# Patient Record
Sex: Female | Born: 1970 | Hispanic: Yes | Marital: Married | Smoking: Never smoker
Health system: Southern US, Community
[De-identification: ages and names within clinical notes are randomized; demographics above are authoritative.]

## PROBLEM LIST (undated history)

## (undated) DIAGNOSIS — I10 Essential (primary) hypertension: Secondary | ICD-10-CM

## (undated) HISTORY — DX: Essential (primary) hypertension: I10

## (undated) HISTORY — PX: BREAST EXCISIONAL BIOPSY: SUR124

---

## 2007-04-29 ENCOUNTER — Encounter: Payer: Self-pay | Admitting: Maternal & Fetal Medicine

## 2007-05-16 ENCOUNTER — Emergency Department: Payer: Self-pay | Admitting: Emergency Medicine

## 2007-09-23 ENCOUNTER — Inpatient Hospital Stay: Payer: Self-pay

## 2014-06-09 ENCOUNTER — Other Ambulatory Visit: Payer: Self-pay | Admitting: Oncology

## 2014-06-09 DIAGNOSIS — Z139 Encounter for screening, unspecified: Secondary | ICD-10-CM

## 2014-07-10 ENCOUNTER — Encounter (INDEPENDENT_AMBULATORY_CARE_PROVIDER_SITE_OTHER): Payer: Self-pay

## 2014-07-10 ENCOUNTER — Ambulatory Visit
Admission: RE | Admit: 2014-07-10 | Discharge: 2014-07-10 | Disposition: A | Discharge status: Home / Self Care | Payer: Self-pay | Source: Ambulatory Visit | Attending: Oncology | Admitting: Oncology

## 2014-07-10 ENCOUNTER — Other Ambulatory Visit: Payer: Self-pay

## 2014-07-10 ENCOUNTER — Ambulatory Visit: Payer: Self-pay | Attending: Oncology

## 2014-07-10 VITALS — BP 126/83 | HR 70 | Temp 96.7°F | Resp 20

## 2014-07-10 DIAGNOSIS — R920 Mammographic microcalcification found on diagnostic imaging of breast: Secondary | ICD-10-CM

## 2014-07-10 DIAGNOSIS — N63 Unspecified lump in unspecified breast: Secondary | ICD-10-CM

## 2014-07-10 DIAGNOSIS — Z139 Encounter for screening, unspecified: Secondary | ICD-10-CM

## 2014-07-10 DIAGNOSIS — Z0000 Encounter for general adult medical examination without abnormal findings: Secondary | ICD-10-CM

## 2014-07-10 NOTE — Progress Notes (Signed)
Subjective:     Patient ID: Lynn ConnorsCristina Gonzalez Hawkins, female   DOB: 07-20-1970, 44 y.o.   MRN: [id number]  HPI   Review of Systems     Objective:   Physical Exam  Pulmonary/Chest: Right breast exhibits no inverted nipple, no mass, no nipple discharge, no skin change and no tenderness. Left breast exhibits no inverted nipple, no mass, no nipple discharge, no skin change and no tenderness. Breasts are symmetrical.       Assessment:    44 [facility]  CBE unremarkable.  Instructed patient on breast self-exam using teach back method.  Patient screened, and meets [facility] eligibility.  Patient does not have insurance, Medicare or Medicaid.  Handout given on Affordable Care Act.    Plan:     Sent for bilateral screening mammogram.

## 2014-07-18 ENCOUNTER — Ambulatory Visit
Admission: RE | Admit: 2014-07-18 | Discharge: 2014-07-18 | Disposition: A | Discharge status: Home / Self Care | Payer: Self-pay | Source: Ambulatory Visit | Attending: Oncology | Admitting: Oncology

## 2014-07-18 ENCOUNTER — Ambulatory Visit: Payer: Self-pay

## 2014-07-18 ENCOUNTER — Other Ambulatory Visit: Payer: Self-pay

## 2014-07-18 DIAGNOSIS — R921 Mammographic calcification found on diagnostic imaging of breast: Secondary | ICD-10-CM | POA: Insufficient documentation

## 2014-07-18 DIAGNOSIS — N63 Unspecified lump in unspecified breast: Secondary | ICD-10-CM

## 2014-07-18 DIAGNOSIS — R920 Mammographic microcalcification found on diagnostic imaging of breast: Secondary | ICD-10-CM

## 2014-07-25 ENCOUNTER — Other Ambulatory Visit: Payer: Self-pay

## 2014-07-25 DIAGNOSIS — N63 Unspecified lump in unspecified breast: Secondary | ICD-10-CM

## 2014-08-01 NOTE — Progress Notes (Signed)
Patient is scheduled for 6 month follow-up mammogram, and ultrasound of left breast mass on January 19, 2015 at 1:20 p.m.  Mailed appointment notification.

## 2015-01-15 ENCOUNTER — Ambulatory Visit: Payer: Self-pay

## 2015-01-15 ENCOUNTER — Other Ambulatory Visit: Payer: Self-pay

## 2015-01-19 ENCOUNTER — Ambulatory Visit: Payer: Self-pay

## 2015-01-19 ENCOUNTER — Other Ambulatory Visit: Payer: Self-pay

## 2015-01-23 NOTE — Progress Notes (Signed)
Per Neysa Bonitohristy Burton's in-basket note, patient has rescheduled six month follow-up mammogram, and ultrasound to 02/16/15 at 9:20.

## 2015-01-26 ENCOUNTER — Other Ambulatory Visit: Payer: Self-pay

## 2015-01-26 ENCOUNTER — Ambulatory Visit: Payer: Self-pay

## 2015-02-16 ENCOUNTER — Other Ambulatory Visit: Payer: Self-pay

## 2015-02-16 ENCOUNTER — Ambulatory Visit
Admission: RE | Admit: 2015-02-16 | Discharge: 2015-02-16 | Disposition: A | Discharge status: Home / Self Care | Payer: Self-pay | Source: Ambulatory Visit | Attending: Oncology | Admitting: Oncology

## 2015-02-16 DIAGNOSIS — N63 Unspecified lump in unspecified breast: Secondary | ICD-10-CM

## 2015-07-18 ENCOUNTER — Ambulatory Visit
Admission: RE | Admit: 2015-07-18 | Discharge: 2015-07-18 | Disposition: A | Discharge status: Home / Self Care | Payer: Self-pay | Source: Ambulatory Visit | Attending: Oncology | Admitting: Oncology

## 2015-07-18 ENCOUNTER — Ambulatory Visit: Payer: Self-pay | Attending: Oncology | Admitting: *Deleted

## 2015-07-18 ENCOUNTER — Encounter: Payer: Self-pay | Admitting: *Deleted

## 2015-07-18 VITALS — BP 130/80 | HR 76 | Temp 98.2°F

## 2015-07-18 DIAGNOSIS — N63 Unspecified lump in unspecified breast: Secondary | ICD-10-CM

## 2015-07-18 NOTE — Progress Notes (Signed)
Subjective:     Patient ID: Otilio ConnorsCristina Gonzalez Sandoval, female   DOB: [birth year], 45 y.o.   MRN: [id number]  HPI   Review of Systems     Objective:   Physical Exam  Pulmonary/Chest: Right breast exhibits no inverted nipple, no mass, no nipple discharge, no skin change and no tenderness. Left breast exhibits no inverted nipple, no mass, no nipple discharge, no skin change and no tenderness. Breasts are symmetrical.    Bilateral nipples with transverse slit       Assessment:     45 [facility] Hispanic [facility]  Maritza, the interpreter present during the interview and exam.  Clinical breast exam unremarkable with notable fibroglandular tissue in the left breast.  Patient due for diagnostic mammo for 6 month f/u and annual for left breast mass.  Taught self breast awareness.  Patient has been screened for eligibility.  She does not have any insurance, Medicare or Medicaid.  She also meets financial eligibility.  Hand-out given on the Affordable Care Act.    Plan:     Bilateral diagnostic mammogram with ultrasound.  Will follow-up per [facility] protocol.

## 2015-08-07 ENCOUNTER — Encounter: Payer: Self-pay | Admitting: *Deleted

## 2015-08-07 NOTE — Progress Notes (Signed)
Patient informed of her mammogram results the day of her mammo by the radiologist.  She is to follow-up in one year with diagnostic mammogram.  [facility].

## 2016-07-14 ENCOUNTER — Ambulatory Visit: Payer: Self-pay

## 2016-07-30 ENCOUNTER — Ambulatory Visit
Admission: RE | Admit: 2016-07-30 | Discharge: 2016-07-30 | Disposition: A | Discharge status: Home / Self Care | Payer: Self-pay | Source: Ambulatory Visit | Attending: Oncology | Admitting: Oncology

## 2016-07-30 ENCOUNTER — Ambulatory Visit: Payer: Self-pay | Attending: Oncology

## 2016-07-30 ENCOUNTER — Encounter (INDEPENDENT_AMBULATORY_CARE_PROVIDER_SITE_OTHER): Payer: Self-pay

## 2016-07-30 VITALS — BP 166/91 | HR 76 | Temp 98.7°F | Resp 18 | Ht 60.0 in | Wt 195.0 lb

## 2016-07-30 DIAGNOSIS — N63 Unspecified lump in unspecified breast: Secondary | ICD-10-CM

## 2016-07-30 NOTE — Progress Notes (Signed)
Subjective:     Patient ID: Lynn Hawkins, female   DOB: Jul 12, 1970, 46 y.o.   MRN: [id number]  HPI   Review of Systems     Objective:   Physical Exam  Pulmonary/Chest: Right breast exhibits no inverted nipple, no mass, no nipple discharge, no skin change and no tenderness. Left breast exhibits no inverted nipple, no mass, no nipple discharge, no skin change and no tenderness. Breasts are symmetrical.    Bilateral transverse  Mid-nipple indentation       Assessment:     46 year old hispanic patient presents for [facility] clinic visit. She is being followed for Birads 3 mammogram, bilateral fibroadenomas.  Patient screened, and meets [facility] eligibility.  Patient does not have insurance, Medicare or Medicaid.  Handout given on Affordable Care Act.  Instructed patient on breast self-exam using teach back method.  CBE unremarkable.  No mass or lump palpated. Jaqui Laukaitis interpreted exam.    Plan:     Sent for bilateral diagnostic mammogram and ultrasound.

## 2016-08-01 NOTE — Progress Notes (Signed)
Letter mailed from [facility] to notify of normal mammogram results.  Patient to return in one year for annual screening.  Copy to HSIS. 

## 2017-04-10 IMAGING — MG MM DIGITAL DIAGNOSTIC BILAT W/ TOMO W/ CAD
8 of 12 series · 8 of 28 positions shown · non-contrast
Comparison: Previous exams including left breast diagnostic
mammogram and ultrasound dated 02/16/2015.

CLINICAL DATA: Six-month follow-up for probably benign
fibroadenomas in the left breast.

EXAM:
2D DIGITAL DIAGNOSTIC LEFT MAMMOGRAM WITH CAD AND ADJUNCT TOMO
ULTRASOUND LEFT BREAST

[R MLO synth-2D]
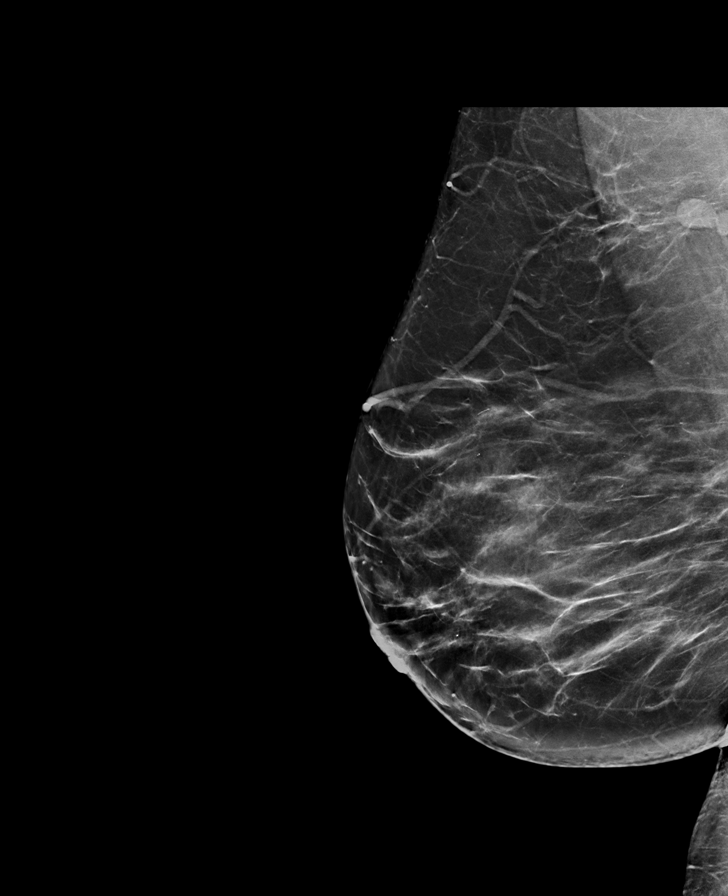

[R CC]
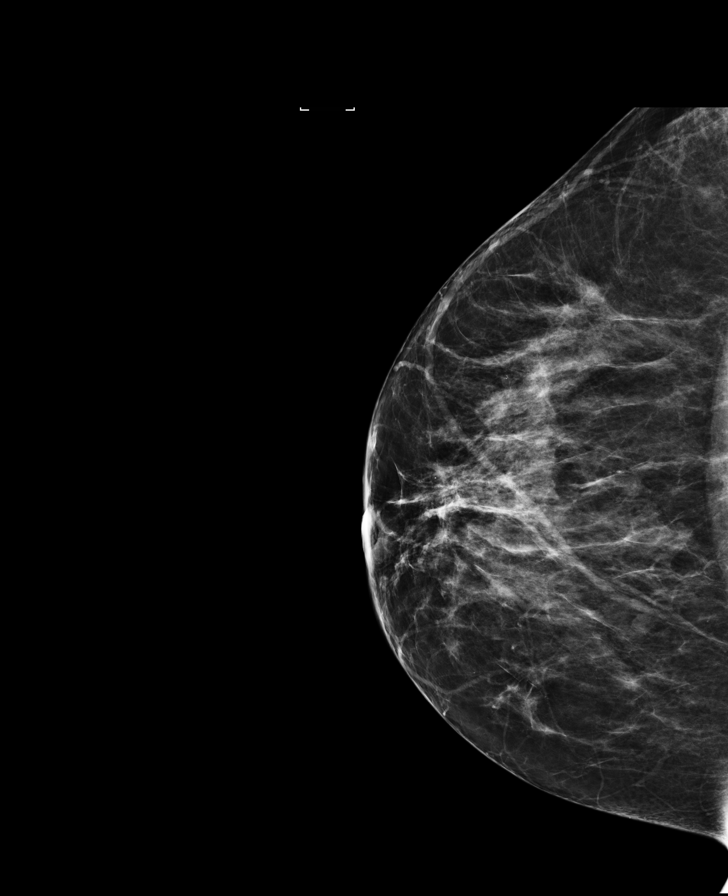

[R MLO]
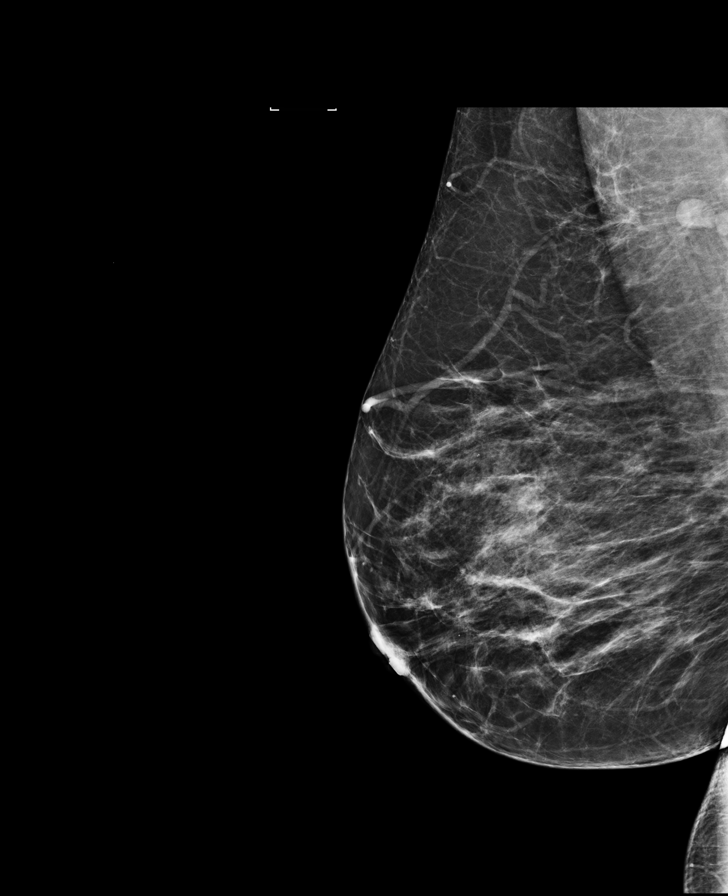

[R CC synth-2D]
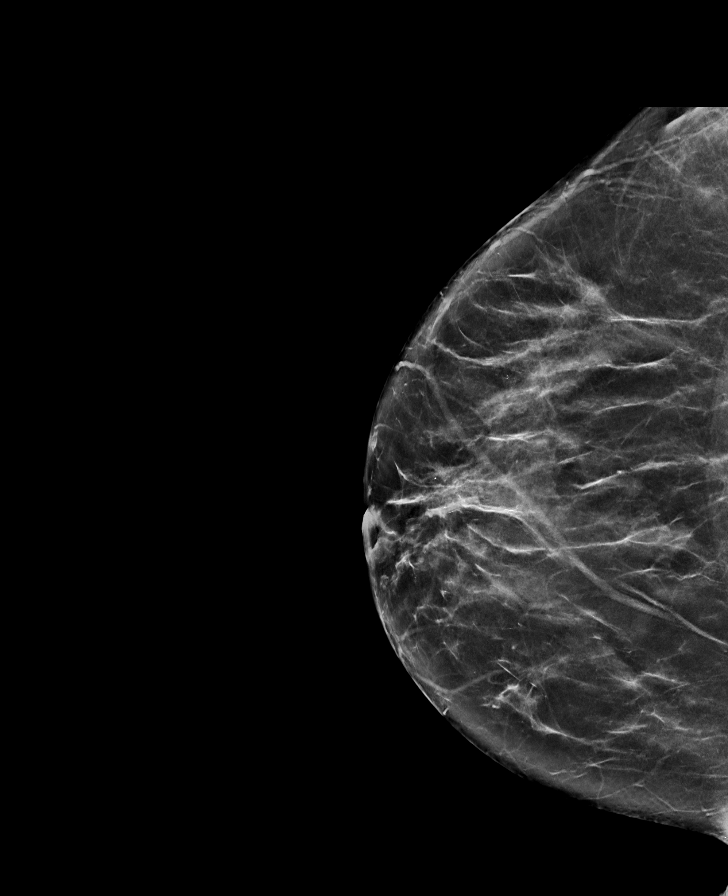

[L MLO synth-2D]
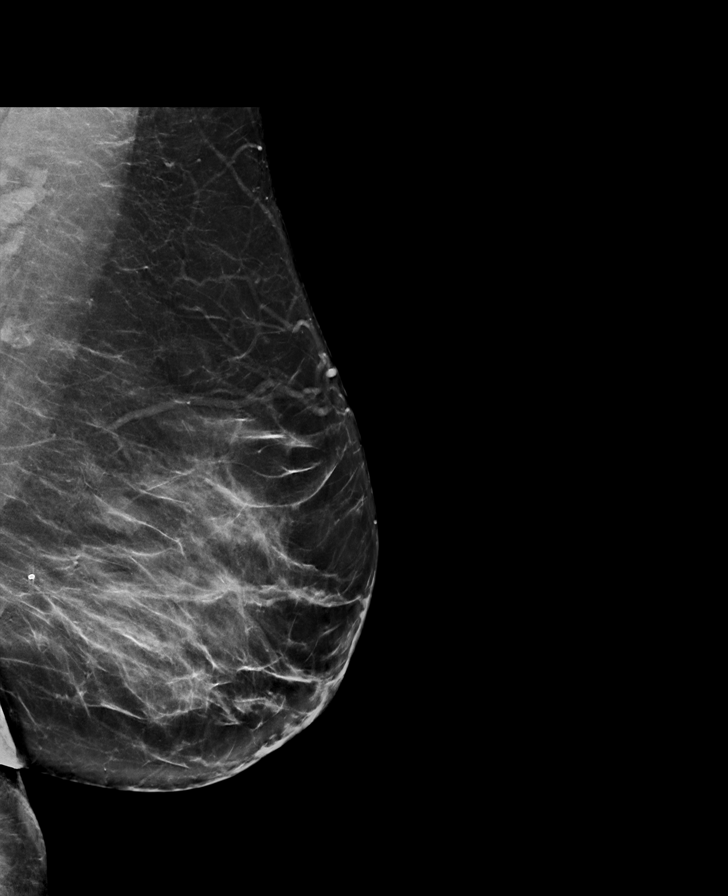

[L MLO]
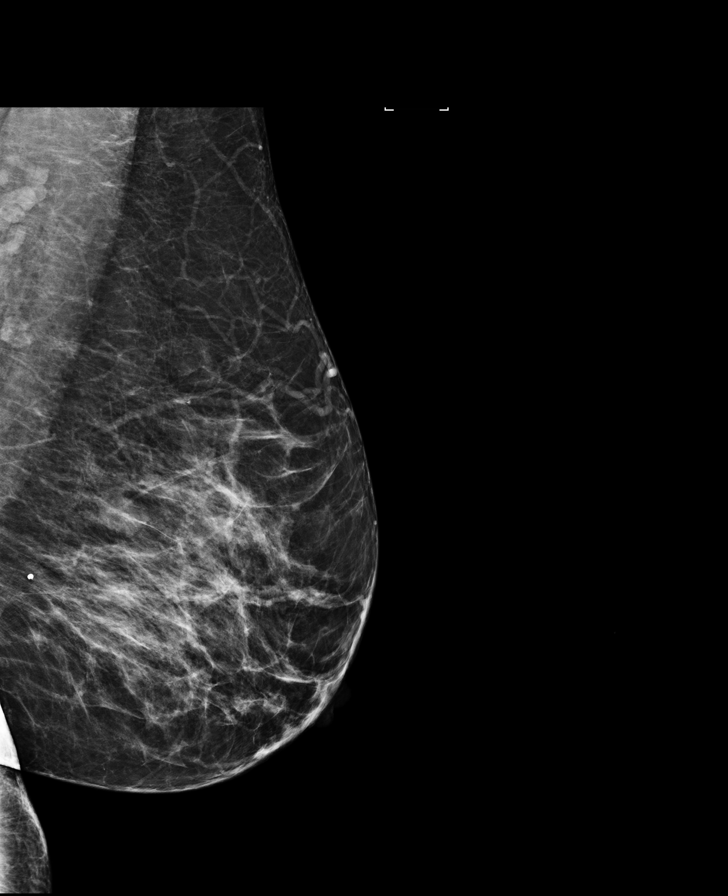

[L CC]
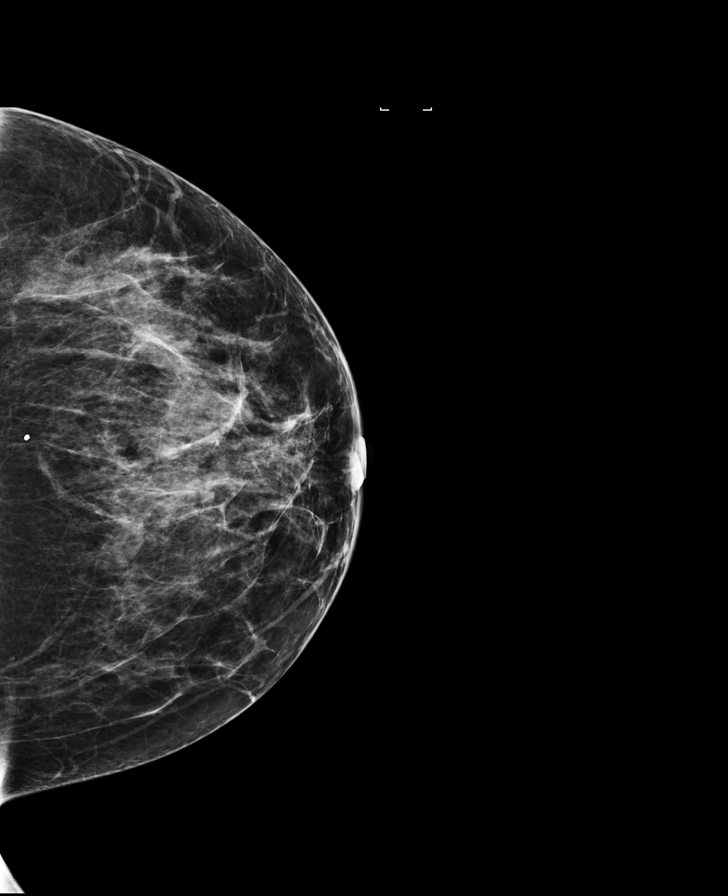

[L CC synth-2D]
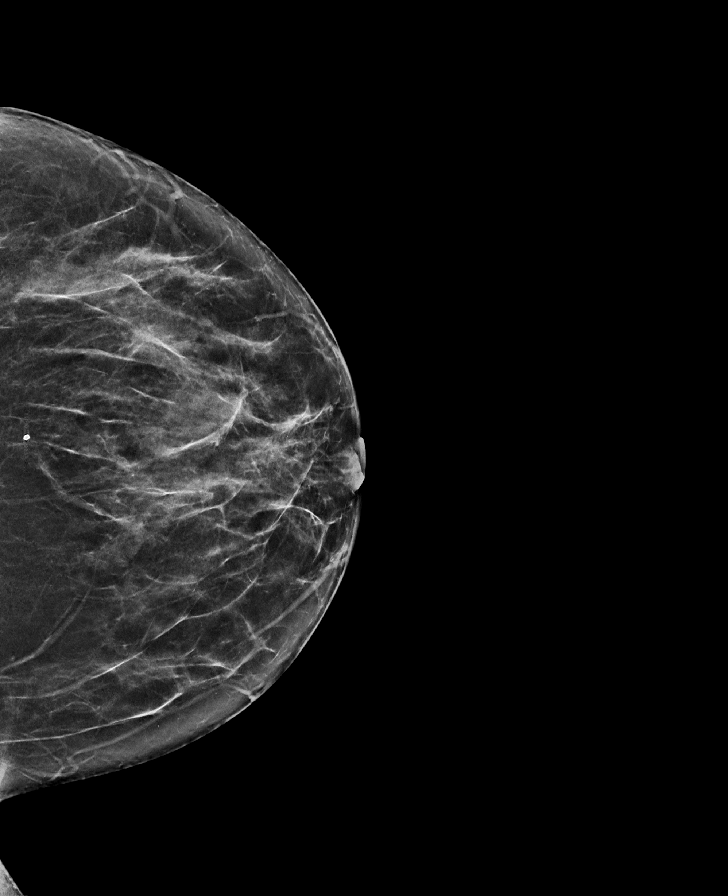

[8 of 28 positions shown; findings below may reference images not displayed]

ACR Breast Density Category c: The breast tissue is heterogeneously
dense, which may obscure small masses.
FINDINGS: The previously described mass within the upper inner quadrant of the
left breast is again less conspicuous on today's exam, particularly
in the MLO projection. There are no new dominant masses, suspicious
calcifications or secondary signs of malignancy within either
breast.

Mammographic images were processed with CAD.

Targeted ultrasound is performed, showing a stable oval
circumscribed hypoechoic mass within the left breast at the 10:30
o'clock axis, 5 cm from the nipple, measuring 1.4 x 0.7 x 1.1 cm,
with internal septation, most suggestive of benign fibroadenoma.

Additional oval circumscribed hypoechoic mass is again seen within
the left breast at the 10:30 o'clock axis, 4 cm from the nipple,
measuring 0.6 x 0.4 x 0.5 cm, also most suggestive of benign
fibroadenoma.
IMPRESSION: Stable probably benign fibroadenomas within the left breast. No
evidence of malignancy within the right breast.

Recommend additional follow-up left breast diagnostic mammogram and
ultrasound in 12 months to ensure 2 year stability. This will be
performed as a bilateral diagnostic mammogram in conjunction with
patient's routine annual right breast screening mammogram schedule.

RECOMMENDATION:
Bilateral diagnostic mammogram and left breast ultrasound in 12
months.

I have discussed the findings and recommendations with the patient.
Results were also provided in writing at the conclusion of the
visit. If applicable, a reminder letter will be sent to the patient
regarding the next appointment.

BI-RADS CATEGORY  3: Probably benign.

## 2017-09-23 ENCOUNTER — Encounter (INDEPENDENT_AMBULATORY_CARE_PROVIDER_SITE_OTHER): Payer: Self-pay

## 2017-09-23 ENCOUNTER — Ambulatory Visit: Payer: Self-pay | Attending: Oncology | Admitting: *Deleted

## 2017-09-23 ENCOUNTER — Encounter: Payer: Self-pay | Admitting: *Deleted

## 2017-09-23 ENCOUNTER — Ambulatory Visit
Admission: RE | Admit: 2017-09-23 | Discharge: 2017-09-23 | Disposition: A | Discharge status: Home / Self Care | Payer: Self-pay | Source: Ambulatory Visit | Attending: Oncology | Admitting: Oncology

## 2017-09-23 VITALS — BP 136/87 | HR 73 | Temp 96.1°F | Resp 18 | Ht 66.0 in | Wt 195.0 lb

## 2017-09-23 DIAGNOSIS — Z0000 Encounter for general adult medical examination without abnormal findings: Secondary | ICD-10-CM

## 2017-09-23 NOTE — Patient Instructions (Signed)
Gave patient hand-out, Women Staying Healthy, Active and Well from [facility], with education on breast health, pap smears, heart and colon health. 

## 2017-09-23 NOTE — Progress Notes (Signed)
  Subjective:     Patient ID: Lynn Hawkins, female   DOB: [birth year], 47 y.o.   MRN: [id number]  HPI   Review of Systems     Objective:   Physical Exam  Pulmonary/Chest: Right breast exhibits no inverted nipple, no mass, no nipple discharge, no skin change and no tenderness. Left breast exhibits no inverted nipple, no mass, no nipple discharge, no skin change and no tenderness.         Assessment:     47 [facility] Hispanic [facility]  Orson SlickJacqui, the interpreter present during the interview and exam.  Clinical breast exam unremarkable.  Taught self breast awareness.  Last pap on 09/10/15 was negative without HPV co-testing.  Next pap due in 2020.  Patient has been screened for eligibility.  She does not have any insurance, Medicare or Medicaid.  She also meets financial eligibility.  Hand-out given on the Affordable Care Act.  Risk Assessment    Risk Scores      09/23/2017   Last edited by: Lenn SinkWalker, Christine A, RN   5-year risk: 1.2 %   Lifetime risk: 9.7 %            Plan:     Screening mammogram ordered.  Will follow-up per [facility] protocol.

## 2017-09-24 ENCOUNTER — Encounter: Payer: Self-pay | Admitting: *Deleted

## 2017-09-24 NOTE — Progress Notes (Signed)
Letter mailed from the [facility] to inform patient of her normal mammogram results.  Patient is to follow-up with annual screening in one year.  HSIS to [place]. 

## 2018-12-20 ENCOUNTER — Other Ambulatory Visit: Payer: Self-pay

## 2018-12-20 DIAGNOSIS — Z20822 Contact with and (suspected) exposure to covid-19: Secondary | ICD-10-CM

## 2018-12-21 LAB — NOVEL CORONAVIRUS, NAA: SARS-CoV-2, NAA: NOT DETECTED

## 2019-05-22 ENCOUNTER — Ambulatory Visit: Payer: Self-pay | Attending: Internal Medicine

## 2019-05-22 DIAGNOSIS — Z23 Encounter for immunization: Secondary | ICD-10-CM

## 2019-05-22 NOTE — Progress Notes (Signed)
   Covid-19 Vaccination Clinic  Name:  Lynn Hawkins    MRN: [id number] DOB: 1970/11/22  05/22/2019  [name] was observed post Covid-19 immunization for 15 minutes without incident. She was provided with Vaccine Information Sheet and instruction to access the V-Safe system.   [name] was instructed to call 911 with any severe reactions post vaccine: Marland Kitchen Difficulty breathing  . Swelling of face and throat  . A fast heartbeat  . A bad rash all over body  . Dizziness and weakness   Immunizations Administered    Name Date Dose VIS Date Route   Pfizer COVID-19 Vaccine 05/22/2019  3:35 PM 0.3 mL 02/04/2019 Intramuscular   Manufacturer: [employer], [employer]   Lot: [id number]   NDC: [address]-4

## 2019-06-12 ENCOUNTER — Ambulatory Visit: Payer: Self-pay | Attending: Internal Medicine

## 2019-06-12 DIAGNOSIS — Z23 Encounter for immunization: Secondary | ICD-10-CM

## 2019-06-12 NOTE — Progress Notes (Signed)
   Covid-19 Vaccination Clinic  Name:  Lynn Hawkins    MRN: [id number] DOB: [birth year]  06/12/2019  [name] was observed post Covid-19 immunization for 15 minutes without incident. She was provided with Vaccine Information Sheet and instruction to access the V-Safe system.   [name] was instructed to call 911 with any severe reactions post vaccine: Marland Kitchen Difficulty breathing  . Swelling of face and throat  . A fast heartbeat  . A bad rash all over body  . Dizziness and weakness   Immunizations Administered    Name Date Dose VIS Date Route   Pfizer COVID-19 Vaccine 06/12/2019  3:33 PM 0.3 mL 02/04/2019 Intramuscular   Manufacturer: [employer], [employer]   Lot: [id number]   NDC: [address]-7

## 2020-02-14 NOTE — Progress Notes (Signed)
A televisit was used to enroll and consent patient to our [facility] program.  Rodrico [phone], from [facility] [employer] was used for interpretation.  Patient has been screened for eligibility.  She does not have any insurance, Medicare or Medicaid.  She also meets financial eligibility.   Patients health history was obtained.  She denies any breast problems.  Last pap was completed on 02/08/20 at the [facility].  Patient was instructed to go directly to the [facility] for her mammogram tomorrow at 1:00.  Will follow up per [facility] protocol

## 2020-02-15 ENCOUNTER — Other Ambulatory Visit: Payer: Self-pay

## 2020-02-15 ENCOUNTER — Ambulatory Visit
Admission: RE | Admit: 2020-02-15 | Discharge: 2020-02-15 | Disposition: A | Discharge status: Home / Self Care | Payer: Self-pay | Source: Ambulatory Visit | Attending: Oncology | Admitting: Oncology

## 2020-02-15 ENCOUNTER — Ambulatory Visit: Payer: Self-pay | Attending: Oncology

## 2020-02-15 ENCOUNTER — Encounter: Payer: Self-pay | Admitting: *Deleted

## 2020-02-15 DIAGNOSIS — Z0000 Encounter for general adult medical examination without abnormal findings: Secondary | ICD-10-CM

## 2020-03-08 NOTE — Progress Notes (Signed)
Letter mailed from [facility] to notify of normal mammogram results.  Patient to return in one year for annual screening.  Copy to HSIS. 

## 2021-03-13 ENCOUNTER — Other Ambulatory Visit: Payer: Self-pay

## 2021-03-13 DIAGNOSIS — Z1231 Encounter for screening mammogram for malignant neoplasm of breast: Secondary | ICD-10-CM

## 2021-04-17 ENCOUNTER — Other Ambulatory Visit: Payer: Self-pay

## 2021-04-17 DIAGNOSIS — Z1211 Encounter for screening for malignant neoplasm of colon: Secondary | ICD-10-CM

## 2021-04-23 ENCOUNTER — Other Ambulatory Visit: Payer: Self-pay

## 2021-04-23 ENCOUNTER — Ambulatory Visit: Payer: Self-pay | Attending: Oncology | Admitting: Hematology and Oncology

## 2021-04-23 ENCOUNTER — Ambulatory Visit
Admission: RE | Admit: 2021-04-23 | Discharge: 2021-04-23 | Disposition: A | Discharge status: Home / Self Care | Payer: Self-pay | Source: Ambulatory Visit | Attending: Obstetrics and Gynecology | Admitting: Obstetrics and Gynecology

## 2021-04-23 VITALS — BP 157/76 | HR 77 | Temp 98.7°F | Resp 20 | Ht 61.0 in | Wt 196.0 lb

## 2021-04-23 DIAGNOSIS — Z1231 Encounter for screening mammogram for malignant neoplasm of breast: Secondary | ICD-10-CM | POA: Insufficient documentation

## 2021-04-23 NOTE — Progress Notes (Addendum)
[name] is a 51 y.o. female who presents to [facility] clinic today with complaint of .    Pap Smear: Pap not smear completed today. Last Pap smear was 01/09/2020 at [facility]  clinic and was normal. Per patient has no history of an abnormal Pap smear. Last Pap smear result is available in Epic.   Physical exam: Breasts Breasts symmetrical. No skin abnormalities bilateral breasts. No nipple retraction bilateral breasts. No nipple discharge bilateral breasts. No lymphadenopathy. No lumps palpated bilateral breasts.       Pelvic/Bimanual Pap is not indicated today    Smoking History: Patient has never smoked and was not referred to quit line.    Patient Navigation: Patient education provided. Access to services provided for patient through [facility] program. A [facility]  interpreter provided. No transportation provided   Colorectal Cancer Screening: Per patient  was given FIT test before.  No complaints today.    Breast and Cervical Cancer Risk Assessment: Patient does not have family history of breast cancer, known genetic mutations, or radiation treatment to the chest before age 58. Patient does not have history of cervical dysplasia, immunocompromised, or DES exposure in-utero.  Risk Assessment     Risk Scores       04/23/2021 02/15/2020   Last edited by: Neita Garnet, CMA Novella Rob, RT   5-year risk: 0.7 % 0.7 %   Lifetime risk: 6.1 % 6.2 %           A: [facility] exam without pap smear No complaints, benign exam  P: [facility] to [facility] for a screening mammogram. Appointment scheduled at 4:20 pm.  Pascal Lux, NP 04/23/2021 3:08 PM

## 2021-08-10 ENCOUNTER — Other Ambulatory Visit: Payer: Self-pay

## 2021-08-10 ENCOUNTER — Emergency Department (HOSPITAL_COMMUNITY)
Admission: EM | Admit: 2021-08-10 | Discharge: 2021-08-10 | Disposition: A | Discharge status: Home / Self Care | Payer: No Typology Code available for payment source | Attending: Emergency Medicine | Admitting: Emergency Medicine

## 2021-08-10 ENCOUNTER — Encounter (HOSPITAL_COMMUNITY): Payer: Self-pay

## 2021-08-10 ENCOUNTER — Emergency Department (HOSPITAL_COMMUNITY): Payer: No Typology Code available for payment source

## 2021-08-10 DIAGNOSIS — Z79899 Other long term (current) drug therapy: Secondary | ICD-10-CM | POA: Insufficient documentation

## 2021-08-10 DIAGNOSIS — Z23 Encounter for immunization: Secondary | ICD-10-CM | POA: Insufficient documentation

## 2021-08-10 DIAGNOSIS — Y92410 Unspecified street and highway as the place of occurrence of the external cause: Secondary | ICD-10-CM | POA: Diagnosis not present

## 2021-08-10 DIAGNOSIS — S20102A Unspecified superficial injuries of breast, left breast, initial encounter: Secondary | ICD-10-CM | POA: Diagnosis present

## 2021-08-10 DIAGNOSIS — S20112A Abrasion of breast, left breast, initial encounter: Secondary | ICD-10-CM | POA: Insufficient documentation

## 2021-08-10 DIAGNOSIS — I10 Essential (primary) hypertension: Secondary | ICD-10-CM | POA: Insufficient documentation

## 2021-08-10 LAB — URINALYSIS, ROUTINE W REFLEX MICROSCOPIC
Bacteria, UA: NONE SEEN
Bilirubin Urine: NEGATIVE
Glucose, UA: NEGATIVE mg/dL
Hgb urine dipstick: NEGATIVE
Ketones, ur: NEGATIVE mg/dL
Nitrite: NEGATIVE
Protein, ur: 100 mg/dL — AB
Specific Gravity, Urine: 1.024 (ref 1.005–1.030)
pH: 5 (ref 5.0–8.0)

## 2021-08-10 LAB — CBC
HCT: 35.7 % — ABNORMAL LOW (ref 36.0–46.0)
Hemoglobin: 11.3 g/dL — ABNORMAL LOW (ref 12.0–15.0)
MCH: 26 pg (ref 26.0–34.0)
MCHC: 31.7 g/dL (ref 30.0–36.0)
MCV: 82.3 fL (ref 80.0–100.0)
Platelets: 396 10*3/uL (ref 150–400)
RBC: 4.34 MIL/uL (ref 3.87–5.11)
RDW: 15.2 % (ref 11.5–15.5)
WBC: 24.5 10*3/uL — ABNORMAL HIGH (ref 4.0–10.5)
nRBC: 0 % (ref 0.0–0.2)

## 2021-08-10 LAB — COMPREHENSIVE METABOLIC PANEL
ALT: 19 U/L (ref 0–44)
AST: 21 U/L (ref 15–41)
Albumin: 3.4 g/dL — ABNORMAL LOW (ref 3.5–5.0)
Alkaline Phosphatase: 79 U/L (ref 38–126)
Anion gap: 11 (ref 5–15)
BUN: 9 mg/dL (ref 6–20)
CO2: 20 mmol/L — ABNORMAL LOW (ref 22–32)
Calcium: 9.3 mg/dL (ref 8.9–10.3)
Chloride: 106 mmol/L (ref 98–111)
Creatinine, Ser: 0.72 mg/dL (ref 0.44–1.00)
GFR, Estimated: >60 mL/min (ref >60)
Glucose, Bld: 153 mg/dL — ABNORMAL HIGH (ref 70–99)
Potassium: 3.9 mmol/L (ref 3.5–5.1)
Sodium: 137 mmol/L (ref 135–145)
Total Bilirubin: 0.3 mg/dL (ref 0.3–1.2)
Total Protein: 6.9 g/dL (ref 6.5–8.1)

## 2021-08-10 LAB — ETHANOL: Alcohol, Ethyl (B): <10 mg/dL (ref <10)

## 2021-08-10 MED ORDER — HYDROCODONE-ACETAMINOPHEN 5-325 MG PO TABS: 2.0000 | ORAL_TABLET | ORAL | 0 refills | Status: AC | PRN

## 2021-08-10 MED ORDER — FENTANYL CITRATE PF 50 MCG/ML IJ SOSY
50.0000 ug | PREFILLED_SYRINGE | Freq: Once | INTRAMUSCULAR | Status: AC
  Administered 2021-08-10: 50 ug via INTRAVENOUS
  Filled 2021-08-10: qty 1

## 2021-08-10 MED ORDER — ONDANSETRON HCL 4 MG/2ML IJ SOLN: 4.0000 mg | Freq: Once | INTRAMUSCULAR | Status: DC

## 2021-08-10 MED ORDER — TETANUS-DIPHTH-ACELL PERTUSSIS 5-2.5-18.5 LF-MCG/0.5 IM SUSY
0.5000 mL | PREFILLED_SYRINGE | Freq: Once | INTRAMUSCULAR | Status: AC
  Administered 2021-08-10: 0.5 mL via INTRAMUSCULAR
  Filled 2021-08-10: qty 0.5

## 2021-08-10 MED ORDER — MORPHINE SULFATE (PF) 4 MG/ML IV SOLN: 4.0000 mg | Freq: Once | INTRAVENOUS | Status: DC

## 2021-08-10 MED ORDER — IOHEXOL 350 MG/ML SOLN
80.0000 mL | Freq: Once | INTRAVENOUS | Status: AC | PRN
  Administered 2021-08-10: 80 mL via INTRAVENOUS

## 2021-08-10 MED ORDER — ONDANSETRON HCL 4 MG/2ML IJ SOLN
4.0000 mg | Freq: Once | INTRAMUSCULAR | Status: AC
  Administered 2021-08-10: 4 mg via INTRAVENOUS
  Filled 2021-08-10: qty 2

## 2021-08-10 MED ORDER — CYCLOBENZAPRINE HCL 10 MG PO TABS: 10.0000 mg | ORAL_TABLET | Freq: Three times a day (TID) | ORAL | 0 refills | Status: AC
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
## 2021-08-10 NOTE — Discharge Instructions (Addendum)
Le he recetado relajantes de musculo para ayudar con su dolor. Tambien le he recetado medicina para ayudar con su dolor. Todos sus scans fueron negativo hoy.  Puede ponerse panos calientes para ayudar con la inchason.
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
## 2021-09-24 ENCOUNTER — Encounter: Payer: Self-pay | Admitting: Family Medicine

## 2021-10-21 ENCOUNTER — Other Ambulatory Visit: Payer: Self-pay

## 2021-10-21 DIAGNOSIS — N6320 Unspecified lump in the left breast, unspecified quadrant: Secondary | ICD-10-CM

## 2021-10-25 ENCOUNTER — Ambulatory Visit: Payer: Self-pay | Attending: Hematology and Oncology | Admitting: Hematology and Oncology

## 2021-10-25 VITALS — BP 146/82 | Wt 201.4 lb

## 2021-10-25 DIAGNOSIS — N6320 Unspecified lump in the left breast, unspecified quadrant: Secondary | ICD-10-CM
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
## 2021-11-07 ENCOUNTER — Ambulatory Visit
Admission: RE | Admit: 2021-11-07 | Discharge: 2021-11-07 | Disposition: A | Discharge status: Home / Self Care | Payer: Self-pay | Source: Ambulatory Visit | Attending: Obstetrics and Gynecology | Admitting: Obstetrics and Gynecology

## 2021-11-07 DIAGNOSIS — N6320 Unspecified lump in the left breast, unspecified quadrant: Secondary | ICD-10-CM

## 2021-12-11 ENCOUNTER — Other Ambulatory Visit: Payer: Self-pay

## 2021-12-11 DIAGNOSIS — N6320 Unspecified lump in the left breast, unspecified quadrant: Secondary | ICD-10-CM

## 2022-02-11 ENCOUNTER — Ambulatory Visit
Admission: RE | Admit: 2022-02-11 | Discharge: 2022-02-11 | Disposition: A | Discharge status: Home / Self Care | Payer: Self-pay | Source: Ambulatory Visit | Attending: Obstetrics and Gynecology | Admitting: Obstetrics and Gynecology

## 2022-02-11 DIAGNOSIS — N6320 Unspecified lump in the left breast, unspecified quadrant: Secondary | ICD-10-CM

## 2022-02-14 ENCOUNTER — Other Ambulatory Visit: Payer: Self-pay | Admitting: Obstetrics and Gynecology

## 2022-02-14 DIAGNOSIS — R928 Other abnormal and inconclusive findings on diagnostic imaging of breast: Secondary | ICD-10-CM

## 2022-03-17 ENCOUNTER — Encounter: Payer: Self-pay | Admitting: Hematology and Oncology

## 2022-03-18 NOTE — Telephone Encounter (Signed)
Follow up would be managed by the Breast Center. We will see you in [facility]CP once a year for clinical exam. The program covers the cost of follow up imaging through the breast center, so you can return for imaging whenever the Breast Center recommends. You can contact the Breast Center directly for appointments with them.

## 2022-07-16 ENCOUNTER — Other Ambulatory Visit: Payer: Self-pay

## 2022-07-16 DIAGNOSIS — N6322 Unspecified lump in the left breast, upper inner quadrant: Secondary | ICD-10-CM

## 2022-08-15 ENCOUNTER — Ambulatory Visit
Admission: RE | Admit: 2022-08-15 | Discharge: 2022-08-15 | Disposition: A | Discharge status: Home / Self Care | Payer: Self-pay | Source: Ambulatory Visit | Attending: Obstetrics and Gynecology | Admitting: Obstetrics and Gynecology

## 2022-08-15 DIAGNOSIS — N6322 Unspecified lump in the left breast, upper inner quadrant: Secondary | ICD-10-CM | POA: Insufficient documentation

## 2023-02-27 ENCOUNTER — Other Ambulatory Visit: Payer: Self-pay

## 2023-02-27 DIAGNOSIS — Z1231 Encounter for screening mammogram for malignant neoplasm of breast: Secondary | ICD-10-CM

## 2023-03-16 ENCOUNTER — Ambulatory Visit
Admission: RE | Admit: 2023-03-16 | Discharge: 2023-03-16 | Disposition: A | Discharge status: Home / Self Care | Payer: Self-pay | Source: Ambulatory Visit | Attending: Obstetrics and Gynecology | Admitting: Obstetrics and Gynecology

## 2023-03-16 ENCOUNTER — Ambulatory Visit: Payer: Self-pay | Attending: Hematology and Oncology | Admitting: *Deleted

## 2023-03-16 VITALS — BP 166/82 | Wt 196.3 lb

## 2023-03-16 DIAGNOSIS — Z1231 Encounter for screening mammogram for malignant neoplasm of breast: Secondary | ICD-10-CM | POA: Insufficient documentation

## 2023-03-16 DIAGNOSIS — Z1239 Encounter for other screening for malignant neoplasm of breast: Secondary | ICD-10-CM
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
## 2023-03-16 NOTE — Patient Instructions (Signed)
Explained breast self awareness with Lynn Hawkins. Patient did not need a Pap smear today due to last Pap smear was 12/25/2022. Let her know based on the result of her last Pap smear that her next Pap smear is due in 3 years. Referred patient to the [facility] for a screening mammogram. Appointment scheduled Monday, March 16, 2023 at 1100. Patient aware of appointment and will be there. Let patient know Delford Field will follow up with her within the next couple weeks with results of her mammogram by letter of phone. Lynn Hawkins verbalized understanding.  Andreina Outten, Kathaleen Maser, RN 10:55 AM

## 2024-02-12 ENCOUNTER — Telehealth: Payer: Self-pay

## 2024-02-23 ENCOUNTER — Other Ambulatory Visit: Payer: Self-pay | Admitting: Family Medicine

## 2024-02-23 DIAGNOSIS — Z1231 Encounter for screening mammogram for malignant neoplasm of breast: Secondary | ICD-10-CM

## 2024-03-18 ENCOUNTER — Ambulatory Visit
Admission: RE | Admit: 2024-03-18 | Discharge: 2024-03-18 | Disposition: A | Payer: Self-pay | Source: Ambulatory Visit | Attending: Family Medicine | Admitting: Family Medicine

## 2024-03-18 DIAGNOSIS — Z1231 Encounter for screening mammogram for malignant neoplasm of breast: Secondary | ICD-10-CM | POA: Insufficient documentation
